# Patient Record
Sex: Female | Born: 1975 | Race: White | Hispanic: No | Marital: Married | Smoking: Former smoker
Health system: Southern US, Community
[De-identification: ages and names within clinical notes are randomized; demographics above are authoritative.]

## PROBLEM LIST (undated history)

## (undated) DIAGNOSIS — F32A Depression, unspecified: Secondary | ICD-10-CM

## (undated) DIAGNOSIS — E785 Hyperlipidemia, unspecified: Secondary | ICD-10-CM

## (undated) DIAGNOSIS — I10 Essential (primary) hypertension: Secondary | ICD-10-CM

## (undated) DIAGNOSIS — G43909 Migraine, unspecified, not intractable, without status migrainosus: Secondary | ICD-10-CM

## (undated) DIAGNOSIS — F319 Bipolar disorder, unspecified: Secondary | ICD-10-CM

## (undated) DIAGNOSIS — F418 Other specified anxiety disorders: Secondary | ICD-10-CM

## (undated) DIAGNOSIS — F419 Anxiety disorder, unspecified: Secondary | ICD-10-CM

## (undated) DIAGNOSIS — E049 Nontoxic goiter, unspecified: Secondary | ICD-10-CM

## (undated) DIAGNOSIS — F329 Major depressive disorder, single episode, unspecified: Secondary | ICD-10-CM

## (undated) DIAGNOSIS — I071 Rheumatic tricuspid insufficiency: Secondary | ICD-10-CM

## (undated) HISTORY — DX: Essential (primary) hypertension: I10

## (undated) HISTORY — DX: Hyperlipidemia, unspecified: E78.5

## (undated) HISTORY — DX: Anxiety disorder, unspecified: F41.9

## (undated) HISTORY — DX: Depression, unspecified: F32.A

## (undated) HISTORY — DX: Nontoxic goiter, unspecified: E04.9

## (undated) HISTORY — DX: Major depressive disorder, single episode, unspecified: F32.9

## (undated) HISTORY — DX: Migraine, unspecified, not intractable, without status migrainosus: G43.909

## (undated) HISTORY — DX: Bipolar disorder, unspecified: F31.9

## (undated) HISTORY — DX: Other specified anxiety disorders: F41.8

## (undated) HISTORY — DX: Rheumatic tricuspid insufficiency: I07.1

---

## 1997-12-26 ENCOUNTER — Emergency Department (HOSPITAL_COMMUNITY): Admission: EM | Admit: 1997-12-26 | Discharge: 1997-12-26 | Payer: Self-pay | Admitting: Emergency Medicine

## 2000-06-20 ENCOUNTER — Inpatient Hospital Stay (HOSPITAL_COMMUNITY): Admission: AD | Admit: 2000-06-20 | Discharge: 2000-06-20 | Payer: Self-pay | Admitting: Obstetrics & Gynecology

## 2001-07-05 ENCOUNTER — Other Ambulatory Visit: Admission: RE | Admit: 2001-07-05 | Discharge: 2001-07-05 | Payer: Self-pay | Admitting: Obstetrics and Gynecology

## 2001-09-14 ENCOUNTER — Encounter: Payer: Self-pay | Admitting: Obstetrics and Gynecology

## 2001-09-14 ENCOUNTER — Ambulatory Visit (HOSPITAL_COMMUNITY): Admission: RE | Admit: 2001-09-14 | Discharge: 2001-09-14 | Payer: Self-pay | Admitting: Obstetrics and Gynecology

## 2001-10-13 ENCOUNTER — Inpatient Hospital Stay (HOSPITAL_COMMUNITY): Admission: AD | Admit: 2001-10-13 | Discharge: 2001-10-13 | Payer: Self-pay | Admitting: Obstetrics and Gynecology

## 2001-10-14 ENCOUNTER — Inpatient Hospital Stay (HOSPITAL_COMMUNITY): Admission: AD | Admit: 2001-10-14 | Discharge: 2001-10-14 | Payer: Self-pay | Admitting: Obstetrics and Gynecology

## 2001-10-20 ENCOUNTER — Encounter: Payer: Self-pay | Admitting: Obstetrics and Gynecology

## 2001-10-20 ENCOUNTER — Inpatient Hospital Stay (HOSPITAL_COMMUNITY): Admission: AD | Admit: 2001-10-20 | Discharge: 2001-10-20 | Payer: Self-pay | Admitting: Obstetrics and Gynecology

## 2001-11-29 ENCOUNTER — Inpatient Hospital Stay (HOSPITAL_COMMUNITY): Admission: AD | Admit: 2001-11-29 | Discharge: 2001-11-29 | Payer: Self-pay | Admitting: Obstetrics and Gynecology

## 2001-12-12 ENCOUNTER — Inpatient Hospital Stay (HOSPITAL_COMMUNITY): Admission: AD | Admit: 2001-12-12 | Discharge: 2001-12-12 | Payer: Self-pay | Admitting: Obstetrics and Gynecology

## 2001-12-22 ENCOUNTER — Inpatient Hospital Stay (HOSPITAL_COMMUNITY): Admission: AD | Admit: 2001-12-22 | Discharge: 2001-12-22 | Payer: Self-pay | Admitting: Obstetrics and Gynecology

## 2001-12-23 ENCOUNTER — Inpatient Hospital Stay (HOSPITAL_COMMUNITY): Admission: AD | Admit: 2001-12-23 | Discharge: 2001-12-25 | Payer: Self-pay | Admitting: Obstetrics and Gynecology

## 2002-05-29 ENCOUNTER — Emergency Department (HOSPITAL_COMMUNITY): Admission: EM | Admit: 2002-05-29 | Discharge: 2002-05-29 | Payer: Self-pay | Admitting: *Deleted

## 2002-12-04 ENCOUNTER — Other Ambulatory Visit: Admission: RE | Admit: 2002-12-04 | Discharge: 2002-12-04 | Payer: Self-pay | Admitting: Obstetrics and Gynecology

## 2003-01-11 ENCOUNTER — Inpatient Hospital Stay (HOSPITAL_COMMUNITY): Admission: AD | Admit: 2003-01-11 | Discharge: 2003-01-11 | Payer: Self-pay | Admitting: Obstetrics and Gynecology

## 2003-05-22 ENCOUNTER — Inpatient Hospital Stay (HOSPITAL_COMMUNITY): Admission: AD | Admit: 2003-05-22 | Discharge: 2003-05-22 | Payer: Self-pay | Admitting: Obstetrics and Gynecology

## 2003-06-01 ENCOUNTER — Inpatient Hospital Stay (HOSPITAL_COMMUNITY): Admission: AD | Admit: 2003-06-01 | Discharge: 2003-06-01 | Payer: Self-pay | Admitting: Obstetrics and Gynecology

## 2003-06-03 ENCOUNTER — Inpatient Hospital Stay (HOSPITAL_COMMUNITY): Admission: AD | Admit: 2003-06-03 | Discharge: 2003-06-03 | Payer: Self-pay | Admitting: Obstetrics and Gynecology

## 2003-06-10 ENCOUNTER — Inpatient Hospital Stay (HOSPITAL_COMMUNITY): Admission: AD | Admit: 2003-06-10 | Discharge: 2003-06-12 | Payer: Self-pay | Admitting: Obstetrics and Gynecology

## 2004-02-29 ENCOUNTER — Encounter: Admission: RE | Admit: 2004-02-29 | Discharge: 2004-02-29 | Payer: Self-pay | Admitting: Family Medicine

## 2004-10-28 ENCOUNTER — Encounter: Admission: RE | Admit: 2004-10-28 | Discharge: 2004-10-28 | Payer: Self-pay | Admitting: Endocrinology

## 2005-06-22 ENCOUNTER — Other Ambulatory Visit: Admission: RE | Admit: 2005-06-22 | Discharge: 2005-06-22 | Payer: Self-pay | Admitting: Obstetrics and Gynecology

## 2005-09-25 ENCOUNTER — Emergency Department (HOSPITAL_COMMUNITY): Admission: EM | Admit: 2005-09-25 | Discharge: 2005-09-25 | Payer: Self-pay | Admitting: Emergency Medicine

## 2006-01-01 ENCOUNTER — Emergency Department (HOSPITAL_COMMUNITY): Admission: EM | Admit: 2006-01-01 | Discharge: 2006-01-01 | Payer: Self-pay | Admitting: Emergency Medicine

## 2006-03-20 ENCOUNTER — Emergency Department (HOSPITAL_COMMUNITY): Admission: EM | Admit: 2006-03-20 | Discharge: 2006-03-20 | Payer: Self-pay | Admitting: Emergency Medicine

## 2006-04-19 ENCOUNTER — Encounter: Admission: RE | Admit: 2006-04-19 | Discharge: 2006-04-19 | Payer: Self-pay | Admitting: Endocrinology

## 2006-12-17 ENCOUNTER — Emergency Department (HOSPITAL_COMMUNITY): Admission: EM | Admit: 2006-12-17 | Discharge: 2006-12-17 | Payer: Self-pay | Admitting: *Deleted

## 2007-04-11 ENCOUNTER — Emergency Department (HOSPITAL_COMMUNITY): Admission: EM | Admit: 2007-04-11 | Discharge: 2007-04-11 | Payer: Self-pay | Admitting: Emergency Medicine

## 2007-04-28 ENCOUNTER — Emergency Department (HOSPITAL_COMMUNITY): Admission: EM | Admit: 2007-04-28 | Discharge: 2007-04-28 | Payer: Self-pay | Admitting: Emergency Medicine

## 2007-07-28 ENCOUNTER — Encounter: Admission: RE | Admit: 2007-07-28 | Discharge: 2007-07-28 | Payer: Self-pay | Admitting: Family Medicine

## 2008-07-20 LAB — HM MAMMOGRAPHY

## 2008-08-14 ENCOUNTER — Encounter: Admission: RE | Admit: 2008-08-14 | Discharge: 2008-08-14 | Payer: Self-pay | Admitting: Family Medicine

## 2010-08-10 ENCOUNTER — Encounter: Payer: Self-pay | Admitting: Endocrinology

## 2010-11-11 ENCOUNTER — Emergency Department (HOSPITAL_COMMUNITY): Admission: EM | Admit: 2010-11-11 | Payer: Self-pay | Source: Home / Self Care

## 2010-12-05 NOTE — H&P (Signed)
[facility]  Patient:    Katrina Atkins, Katrina Atkins Visit Number: [id number] MRN: [id number]          Service Type: OBS Location: 910B 9162 01 Attending Physician:  Jaymes Graff A Dictated by:   Saverio Danker, C.N.M. Admit Date:  12/23/2001                           History and Physical  HISTORY OF PRESENT ILLNESS:   The patient is a 35 year old separated white female gravida 4, para 0-1-2-0 at 35-5/7 weeks by LMP, confirmed by ultrasound, who presents today for further evaluation subsequent to elevated liver enzymes documented yesterday.  She was complaining of headache and visual disturbances yesterday and was evaluated for preeclampsia and was noted to have elevated liver enzymes.  Today, she has no further headache but states that she "just doesnt feel right."  She denies any nausea or vomiting. Denies any right upper quadrant pain.  She does report positive fetal movement.  She denies any leaking or vaginal bleeding.  Her pregnancy has been followed at [facility] by the M.D. service and has been complicated by:  1. History of preeclampsia requiring preterm induction at 34 weeks. 2. History of preterm labor with this pregnancy. 3. Elevated blood pressures sporadically throughout this pregnancy. 4. First trimester spotting. 5. First trimester chlamydia with a negative TOC. 6. Positive group B Strep.  ALLERGIES:                    She has no known drug allergies.  PAST MEDICAL HISTORY:         She reports having had the usual childhood diseases.  She reports occasional urinary tract infections and a history of kidney infections in the past.  History of migraine headaches in the past.  PAST SURGICAL HISTORY:        D&C x2 and hospitalization childbirth x1.  GENETIC HISTORY:              Negative.  FAMILY HISTORY:               Significant for paternal grandfather with MI, maternal grandfather with bypass surgery, maternal  grandfather with hypertension, paternal grandmother with thrombosis, maternal grandmother and paternal grandfather with type 2 diabetes, and maternal grandmother with hypothyroidism.  SOCIAL HISTORY:               She is currently separated from her previous partner, has a new relationship that is supportive.  She is of the [employer].  She denies any illicit drug use, alcohol, or smoking with this pregnancy.  PRENATAL LABORATORY DATA:     Her blood type is O+.  Antibody screen is negative.  Syphilis is nonreactive.  Rubella is immune.  Hepatitis B surface antigen is negative.  HIV is nonreactive.  GC and chlamydia are both negative subsequent to new OB and her one-hour Glucola was within normal range.  Her maternal serum alpha-fetoprotein was also within normal range.  Her 36-week beta Strep was positive.  PHYSICAL EXAMINATION:  VITAL SIGNS:                  Blood pressure 120s/60s.  She is afebrile.  HEENT:                        Grossly within normal limits.  HEART:  Regular rhythm and rate.  CHEST:                        Clear.  BREASTS:                      Soft and nontender.  ABDOMEN:                      Gravid with uterine contractions every 10-12 minutes that are mild.  Her fetal heart rate is reactive and reassuring.  Her cervix is 3-4 cm, 80%, vertex, -2, and posterior.  EXTREMITIES:                  Within normal limits.  LABORATORY DATA:              Her SGPT today is 94, SGOT is 41.  Platelets are 281.  Her urine protein is negative.  ASSESSMENT:                   1. Intrauterine pregnancy at 35-5/7 weeks.                               2. Elevated liver enzymes with history of                                  preeclampsia.                               3. Positive group B Strep.                               4. Advanced cervical change.  PLAN:                         Admit to labor and delivery, perform induction of labor per Dr.  Normand Sloop, and to give her penicillin for group B Strep prophylaxis. Dictated by:   Vance Gather Duplantis, C.N.M. Attending Physician:  Michael Litter DD:  12/23/01 TD:  12/23/01 Job: [id number]
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
## 2010-12-05 NOTE — Discharge Summary (Signed)
[facility]  Patient:    Katrina Atkins, Katrina Atkins Visit Number: [id number] MRN: [id number]          Service Type: OBS Location: 910A 9129 01 Attending Physician:  Jaymes Graff A Dictated by:   Mack Guise, C.N.M. Admit Date:  12/23/2001 Discharge Date: 12/25/2001                             Discharge Summary  ADMISSION DIAGNOSES:          1. Intrauterine pregnancy at 35-5/7 weeks.                               2. Increased liver function tests.                               3. History of preeclampsia.  PROCEDURE:                    Normal spontaneous vaginal delivery.  DISCHARGE DIAGNOSES:          1. Intrauterine pregnancy at 35-5/7 weeks.                               2. Increased liver function tests.                               3. History of preeclampsia.                               4. Normal spontaneous vaginal delivery.  HISTORY OF PRESENT ILLNESS:   Ms. Katrina Atkins is a 35 year old, gravida 4, para 0-1-2-1, who presents at 35-5/7 weeks for evaluation secondary to increased liver function tests.  She does not report any PIH symptoms.  Her SGPT was increased from 89 to 94 and SGOT 41 from 43, platelets 281,000.  Her blood pressure has been normal and she is nonproteinuric, but induction of labor is planned secondary to increased liver function tests and history of preeclampsia.  HOSPITAL COURSE:              Her labor progressed normally and she went on to have a normal spontaneous vaginal delivery with the birth of a 5 pound 13 ounce female infant named Alex with Apgars of 7 at one minute and 8 at five minutes.  Cord pH 7.34.  The patient has done well in the immediate postpartum period.  Her hemoglobin today is 11.4.  Her SGOT and SGPT have been decreasing and her hepatitis panel is negative.  Her blood pressures have all been within normal limits and she is deemed to be in satisfactory condition for discharge.  DISCHARGE INSTRUCTIONS:        Per [facility] handout.  DISCHARGE MEDICATIONS:        1. Motrin 600 mg p.o. q.6h. p.r.n. pain.                               2. Prenatal vitamins.  FOLLOW-UP:                    The patient  will follow up in the office of [facility] on Thursday or Friday for blood pressure check and to recheck liver function tests. Dictated by:   Mack Guise, C.N.M. Attending Physician:  Jaymes Graff A DD:  12/25/01 TD:  12/27/01 Job: [id number]

## 2011-01-30 ENCOUNTER — Encounter: Payer: Self-pay | Admitting: Family Medicine

## 2011-01-30 DIAGNOSIS — F319 Bipolar disorder, unspecified: Secondary | ICD-10-CM | POA: Insufficient documentation

## 2011-02-13 ENCOUNTER — Other Ambulatory Visit: Payer: Self-pay | Admitting: Family Medicine

## 2011-02-13 DIAGNOSIS — Z1231 Encounter for screening mammogram for malignant neoplasm of breast: Secondary | ICD-10-CM

## 2011-02-17 ENCOUNTER — Emergency Department (HOSPITAL_COMMUNITY)
Admission: EM | Admit: 2011-02-17 | Discharge: 2011-02-17 | Disposition: A | Discharge status: Home / Self Care | Payer: Medicaid Other | Attending: Emergency Medicine | Admitting: Emergency Medicine

## 2011-02-17 DIAGNOSIS — M109 Gout, unspecified: Secondary | ICD-10-CM | POA: Insufficient documentation

## 2011-02-19 ENCOUNTER — Ambulatory Visit: Payer: Self-pay

## 2011-02-25 ENCOUNTER — Ambulatory Visit
Admission: RE | Admit: 2011-02-25 | Discharge: 2011-02-25 | Disposition: A | Discharge status: Home / Self Care | Payer: Medicaid Other | Source: Ambulatory Visit | Attending: Family Medicine | Admitting: Family Medicine

## 2011-02-25 DIAGNOSIS — Z1231 Encounter for screening mammogram for malignant neoplasm of breast: Secondary | ICD-10-CM

## 2011-06-12 ENCOUNTER — Encounter (HOSPITAL_COMMUNITY): Payer: Self-pay | Admitting: Emergency Medicine

## 2011-06-12 ENCOUNTER — Emergency Department (HOSPITAL_COMMUNITY)
Admission: EM | Admit: 2011-06-12 | Discharge: 2011-06-12 | Disposition: A | Discharge status: Home / Self Care | Payer: Medicaid Other | Attending: Emergency Medicine | Admitting: Emergency Medicine

## 2011-06-12 DIAGNOSIS — R300 Dysuria: Secondary | ICD-10-CM | POA: Insufficient documentation

## 2011-06-12 DIAGNOSIS — R3915 Urgency of urination: Secondary | ICD-10-CM | POA: Insufficient documentation

## 2011-06-12 DIAGNOSIS — M549 Dorsalgia, unspecified: Secondary | ICD-10-CM | POA: Insufficient documentation

## 2011-06-12 DIAGNOSIS — N390 Urinary tract infection, site not specified: Secondary | ICD-10-CM | POA: Insufficient documentation

## 2011-06-12 LAB — URINALYSIS, ROUTINE W REFLEX MICROSCOPIC
Bilirubin Urine: NEGATIVE
Ketones, ur: NEGATIVE mg/dL
Nitrite: NEGATIVE
Specific Gravity, Urine: 1.025 (ref 1.005–1.030)
Urobilinogen, UA: 0.2 mg/dL (ref 0.0–1.0)

## 2011-06-12 LAB — URINE MICROSCOPIC-ADD ON

## 2011-06-12 MED ORDER — PHENAZOPYRIDINE HCL 200 MG PO TABS: 200.0000 mg | ORAL_TABLET | Freq: Three times a day (TID) | ORAL | Status: AC

## 2011-06-12 MED ORDER — NITROFURANTOIN MONOHYD MACRO 100 MG PO CAPS: 100.0000 mg | ORAL_CAPSULE | Freq: Two times a day (BID) | ORAL | Status: DC

## 2011-06-12 NOTE — ED Notes (Signed)
Pain at end of urination x 1 week. nad

## 2011-06-12 NOTE — ED Notes (Signed)
Patient with no complaints at this time. Respirations even and unlabored. Skin warm/dry. Discharge instructions reviewed with patient at this time. Patient given opportunity to voice concerns/ask questions. Patient discharged at this time and left Emergency Department with steady gait.   

## 2011-06-14 MED ORDER — SULFAMETHOXAZOLE-TRIMETHOPRIM 800-160 MG PO TABS: 1.0000 | ORAL_TABLET | Freq: Two times a day (BID) | ORAL | Status: AC

## 2011-06-14 NOTE — ED Provider Notes (Signed)
History     CSN: [id number] Arrival date & time: 06/12/2011 12:44 PM   First MD Initiated Contact with Patient 06/12/11 1307      Chief Complaint  Patient presents with  . Urinary Urgency    (Consider location/radiation/quality/duration/timing/severity/associated sxs/prior treatment) Patient is a 35 y.o. female presenting with frequency. The history is provided by the patient.  Urinary Frequency The current episode started in the past 7 days. The problem has been unchanged. Pertinent negatives include no abdominal pain, arthralgias, chest pain, chills, congestion, fatigue, fever, headaches, joint swelling, myalgias, nausea, neck pain, numbness, rash, sore throat, vomiting or weakness. Associated symptoms comments: She reports burning pain with urination along with suprapubic fullness sensation and low back aching.  She denies vaginal discharge and flank pain.. Exacerbated by: urinating. Treatments tried: Increased fluids including cranberry juice. The treatment provided no relief.    Past Medical History  Diagnosis Date  . Migraine headache   . Bipolar 1 disorder   . Tricuspid regurgitation   . Goiter     History reviewed. No pertinent past surgical history.  No family history on file.  History  Substance Use Topics  . Smoking status: Former Games developer  . Smokeless tobacco: Not on file  . Alcohol Use: No    OB History    Grav Para Term Preterm Abortions TAB SAB Ect Mult Living                  Review of Systems  Constitutional: Negative for fever, chills and fatigue.  HENT: Negative for congestion, sore throat and neck pain.   Eyes: Negative.   Respiratory: Negative for chest tightness and shortness of breath.   Cardiovascular: Negative for chest pain.  Gastrointestinal: Negative for nausea, vomiting and abdominal pain.  Genitourinary: Positive for dysuria, urgency and frequency. Negative for vaginal bleeding and vaginal discharge.  Musculoskeletal: Positive for back  pain. Negative for myalgias, joint swelling and arthralgias.  Skin: Negative.  Negative for rash and wound.  Neurological: Negative for dizziness, weakness, light-headedness, numbness and headaches.  Hematological: Negative.   Psychiatric/Behavioral: Negative.     Allergies  Review of patient's allergies indicates no known allergies.  Home Medications   Current Outpatient Rx  Name Route Sig Dispense Refill  . ACETAMINOPHEN 500 MG PO TABS Oral Take 1,000 mg by mouth every 6 (six) hours as needed. Pain     . LEVONORGESTREL 20 MCG/24HR IU IUD Intrauterine 1 each by Intrauterine route once.      Jeananne Rama SULFATE 0.05-0.25 % OP SOLN Both Eyes Place 2 drops into both eyes 3 (three) times daily as needed. Dry Eyes     . VITAMIN D (ERGOCALCIFEROL) 50000 UNITS PO CAPS Oral Take 50,000 Units by mouth every 7 (seven) days. Patient takes every Saturday.     Marland Kitchen NITROFURANTOIN MONOHYD MACRO 100 MG PO CAPS Oral Take 1 capsule (100 mg total) by mouth 2 (two) times daily. 14 capsule 0  . PHENAZOPYRIDINE HCL 200 MG PO TABS Oral Take 1 tablet (200 mg total) by mouth 3 (three) times daily. 6 tablet 0    BP 143/80  Pulse 76  Temp(Src) 98.2 F (36.8 C) (Oral)  Resp 19  SpO2 97%  LMP 04/12/2011  Physical Exam  Nursing note and vitals reviewed. Constitutional: She is oriented to person, place, and time. She appears well-developed and well-nourished.  HENT:  Head: Normocephalic and atraumatic.  Eyes: Conjunctivae are normal.  Neck: Normal range of motion.  Cardiovascular: Normal rate, regular rhythm,  normal heart sounds and intact distal pulses.   Pulmonary/Chest: Effort normal and breath sounds normal. She has no wheezes.  Abdominal: Soft. Bowel sounds are normal. There is no tenderness. There is no rebound.       Slight discomfort to palpation at suprapubic area without pain or guarding.  Musculoskeletal: Normal range of motion. She exhibits no tenderness.  Neurological: She is alert  and oriented to person, place, and time.  Skin: Skin is warm and dry.  Psychiatric: She has a normal mood and affect.    ED Course  Procedures (including critical care time)  Labs Reviewed  URINALYSIS, ROUTINE W REFLEX MICROSCOPIC - Abnormal; Notable for the following:    Appearance CLOUDY (*)    Hgb urine dipstick TRACE (*)    Leukocytes, UA MODERATE (*)    All other components within normal limits  URINE MICROSCOPIC-ADD ON - Abnormal; Notable for the following:    Squamous Epithelial / LPF FEW (*)    Bacteria, UA MANY (*)    All other components within normal limits  PREGNANCY, URINE  LAB REPORT - SCANNED   No results found.   1. Urinary tract infection       MDM  UTI.        Candis Musa, PA 06/14/11 [phone]

## 2011-06-14 NOTE — ED Provider Notes (Signed)
Pt called back due to symptoms of dizziness and HA and thinks she is having a reaction to the macrobid because her friends tell her that they have all had reactions to macrobid and so she requests a different abx.  Will rewrite her for 1 week of bactrim per her request.  She is also told to follow up with her PCP.  Gavin Pound. Oletta Lamas, MD 06/14/11 [phone]

## 2011-06-14 NOTE — ED Provider Notes (Signed)
Medical screening examination/treatment/procedure(s) were performed by non-physician practitioner and as supervising physician I was immediately available for consultation/collaboration. Keajah Killough, MD, FACEP   Hendy Brindle L Verneda Hollopeter, MD 06/14/11 0723 

## 2012-01-26 ENCOUNTER — Ambulatory Visit: Payer: Medicaid Other | Admitting: Family Medicine

## 2012-02-08 ENCOUNTER — Ambulatory Visit: Payer: Medicaid Other | Admitting: Family Medicine

## 2012-02-08 ENCOUNTER — Encounter: Payer: Self-pay | Admitting: Family Medicine

## 2012-02-23 ENCOUNTER — Ambulatory Visit (INDEPENDENT_AMBULATORY_CARE_PROVIDER_SITE_OTHER): Payer: Medicaid Other | Admitting: Family Medicine

## 2012-02-23 ENCOUNTER — Encounter: Payer: Self-pay | Admitting: Family Medicine

## 2012-02-23 VITALS — BP 132/84 | HR 92 | Temp 97.8°F | Ht 62.0 in | Wt 206.6 lb

## 2012-02-23 DIAGNOSIS — R600 Localized edema: Secondary | ICD-10-CM

## 2012-02-23 DIAGNOSIS — I10 Essential (primary) hypertension: Secondary | ICD-10-CM

## 2012-02-23 DIAGNOSIS — E785 Hyperlipidemia, unspecified: Secondary | ICD-10-CM

## 2012-02-23 DIAGNOSIS — Z8739 Personal history of other diseases of the musculoskeletal system and connective tissue: Secondary | ICD-10-CM | POA: Insufficient documentation

## 2012-02-23 DIAGNOSIS — R609 Edema, unspecified: Secondary | ICD-10-CM

## 2012-02-23 DIAGNOSIS — Z862 Personal history of diseases of the blood and blood-forming organs and certain disorders involving the immune mechanism: Secondary | ICD-10-CM

## 2012-02-23 DIAGNOSIS — Z0000 Encounter for general adult medical examination without abnormal findings: Secondary | ICD-10-CM

## 2012-02-23 MED ORDER — IBUPROFEN 600 MG PO TABS: 600.0000 mg | ORAL_TABLET | Freq: Three times a day (TID) | ORAL | Status: AC | PRN

## 2012-02-23 NOTE — Assessment & Plan Note (Signed)
Blood pressure 132/84, diet controlled.  Encouraged patient to continue to avoid high sodium foods and increase physical activity. Will attempt to control with lifestyle modifications for 6 months. If blood pressure continues to be elevated, will consider starting antihypertensive medications.

## 2012-02-23 NOTE — Progress Notes (Signed)
  Subjective:    Patient ID: Katrina Atkins, female    DOB: 04/01/1976, 36 y.o.   MRN: [id number]  HPI  Patient presents to clinic to establish care. Patient was last seen at [facility] in [place]. Patient complains of left lower extremity swelling and redness. This occurred last week. Patient went to see a physician in [facility]. Lab results were negative and Doppler ruled out DVT. Patient says leg edema and redness have been improving, but she still complains of persistent left heel pain. Patient has a history of gout. Denies any numbness or tingling of lower extremities.  Patient also says she has been diagnosed with high cholesterol, hypertension, and thyroid disease in the past.  She is willing to sign a release of information for her previous records.  Patient says she is now working on her diet, avoiding fried in salty foods. Patient says her blood pressure has improved since then. She has not taken any medication for hypertension or high cholesterol or thyroid disease.  Review of Systems  Per history of present illness    Objective:   Physical Exam  HENT:  Head: Normocephalic and atraumatic.  Cardiovascular: Normal rate and regular rhythm.   Murmur heard. Pulmonary/Chest: Effort normal and breath sounds normal.  Musculoskeletal: Normal range of motion. She exhibits no tenderness.       Mild non-pitting edema LT ankle; no erythema; no calf tenderness; strong pulses  Skin: Skin is warm and dry. No erythema.    Filed Vitals:   02/23/12 1347  BP: 132/84  Pulse: 92  Temp: 97.8 F (36.6 C)          Assessment & Plan:

## 2012-02-23 NOTE — Assessment & Plan Note (Signed)
Patient has a history of gout - unilateral ankle redness and swelling suspicious for gout. Will treat with Motrin 600 q 6 hour PRN x 5 days. Recommended DASH diet, leg elevation at bedtime to decrease edema.

## 2012-02-23 NOTE — Patient Instructions (Addendum)
Great to see you again. Please schedule Lab appointment in the morning: TSH, lipid panel, Vit D level. Pick up high dose Motrin and take for leg pain x 5 days. Elevate your leg over your heart at bedtime. Come back and see me when you are ready for a pap smear.  Gout Gout is an inflammatory condition (arthritis) caused by a buildup of uric acid crystals in the joints. Uric acid is a chemical that is normally present in the blood. Under some circumstances, uric acid can form into crystals in your joints. This causes joint redness, soreness, and swelling (inflammation). Repeat attacks are common. Over time, uric acid crystals can form into masses (tophi) near a joint, causing disfigurement. Gout is treatable and often preventable. CAUSES  The disease begins with elevated levels of uric acid in the blood. Uric acid is produced by your body when it breaks down a naturally found substance called purines. This also happens when you eat certain foods such as meats and fish. Causes of an elevated uric acid level include:  Being passed down from parent to child (heredity).   Diseases that cause increased uric acid production (obesity, psoriasis, some cancers).   Excessive alcohol use.   Diet, especially diets rich in meat and seafood.   Medicines, including certain cancer-fighting drugs (chemotherapy), diuretics, and aspirin.   Chronic kidney disease. The kidneys are no longer able to remove uric acid well.   Problems with metabolism.  Conditions strongly associated with gout include:  Obesity.   High blood pressure.   High cholesterol.   Diabetes.  Not everyone with elevated uric acid levels gets gout. It is not understood why some people get gout and others do not. Surgery, joint injury, and eating too much of certain foods are some of the factors that can lead to gout. SYMPTOMS   An attack of gout comes on quickly. It causes intense pain with redness, swelling, and warmth in a joint.     Fever can occur.   Often, only one joint is involved. Certain joints are more commonly involved:   Base of the big toe.   Knee.   Ankle.   Wrist.   Finger.  Without treatment, an attack usually goes away in a few days to weeks. Between attacks, you usually will not have symptoms, which is different from many other forms of arthritis. DIAGNOSIS  Your caregiver will suspect gout based on your symptoms and exam. Removal of fluid from the joint (arthrocentesis) is done to check for uric acid crystals. Your caregiver will give you a medicine that numbs the area (local anesthetic) and use a needle to remove joint fluid for exam. Gout is confirmed when uric acid crystals are seen in joint fluid, using a special microscope. Sometimes, blood, urine, and X-ray tests are also used. TREATMENT  There are 2 phases to gout treatment: treating the sudden onset (acute) attack and preventing attacks (prophylaxis). Treatment of an Acute Attack  Medicines are used. These include anti-inflammatory medicines or steroid medicines.   An injection of steroid medicine into the affected joint is sometimes necessary.   The painful joint is rested. Movement can worsen the arthritis.   You may use warm or cold treatments on painful joints, depending which works best for you.   Discuss the use of coffee, vitamin C, or cherries with your caregiver. These may be helpful treatment options.  Treatment to Prevent Attacks After the acute attack subsides, your caregiver may advise prophylactic medicine. These medicines either  help your kidneys eliminate uric acid from your body or decrease your uric acid production. You may need to stay on these medicines for a very long time. The early phase of treatment with prophylactic medicine can be associated with an increase in acute gout attacks. For this reason, during the first few months of treatment, your caregiver may also advise you to take medicines usually used for  acute gout treatment. Be sure you understand your caregiver's directions. You should also discuss dietary treatment with your caregiver. Certain foods such as meats and fish can increase uric acid levels. Other foods such as dairy can decrease levels. Your caregiver can give you a list of foods to avoid. HOME CARE INSTRUCTIONS   Do not take aspirin to relieve pain. This raises uric acid levels.   Only take over-the-counter or prescription medicines for pain, discomfort, or fever as directed by your caregiver.   Rest the joint as much as possible. When in bed, keep sheets and blankets off painful areas.   Keep the affected joint raised (elevated).   Use crutches if the painful joint is in your leg.   Drink enough water and fluids to keep your urine clear or pale yellow. This helps your body get rid of uric acid. Do not drink alcoholic beverages. They slow the passage of uric acid.   Follow your caregiver's dietary instructions. Pay careful attention to the amount of protein you eat. Your daily diet should emphasize fruits, vegetables, whole grains, and fat-free or low-fat milk products.   Maintain a healthy body weight.  SEEK MEDICAL CARE IF:   You have an oral temperature above 102 F (38.9 C).   You develop diarrhea, vomiting, or any side effects from medicines.   You do not feel better in 24 hours, or you are getting worse.  SEEK IMMEDIATE MEDICAL CARE IF:   Your joint becomes suddenly more tender and you have:   Chills.   An oral temperature above 102 F (38.9 C), not controlled by medicine.  MAKE SURE YOU:   Understand these instructions.   Will watch your condition.   Will get help right away if you are not doing well or get worse.  Document Released: 07/03/2000 Document Revised: 06/25/2011 Document Reviewed: 10/14/2009 [facility] Patient Information 2012 [place], [place].

## 2012-02-25 ENCOUNTER — Other Ambulatory Visit: Payer: Medicaid Other

## 2012-03-01 ENCOUNTER — Other Ambulatory Visit: Payer: Medicaid Other

## 2012-03-07 ENCOUNTER — Ambulatory Visit: Payer: Medicaid Other | Admitting: Family Medicine

## 2012-03-29 ENCOUNTER — Ambulatory Visit: Payer: Medicaid Other | Admitting: Family Medicine

## 2012-04-11 ENCOUNTER — Ambulatory Visit: Payer: Medicaid Other | Admitting: Family Medicine

## 2012-06-08 ENCOUNTER — Other Ambulatory Visit: Payer: Self-pay | Admitting: Family Medicine

## 2012-06-08 DIAGNOSIS — Z1231 Encounter for screening mammogram for malignant neoplasm of breast: Secondary | ICD-10-CM

## 2012-07-15 ENCOUNTER — Other Ambulatory Visit: Payer: Self-pay | Admitting: Family Medicine

## 2012-07-15 MED ORDER — PERMETHRIN 5 % EX CREA: TOPICAL_CREAM | Freq: Once | CUTANEOUS | Status: DC

## 2012-07-18 ENCOUNTER — Ambulatory Visit
Admission: RE | Admit: 2012-07-18 | Discharge: 2012-07-18 | Disposition: A | Discharge status: Home / Self Care | Payer: Medicaid Other | Source: Ambulatory Visit | Attending: Family Medicine | Admitting: Family Medicine

## 2012-07-18 DIAGNOSIS — Z1231 Encounter for screening mammogram for malignant neoplasm of breast: Secondary | ICD-10-CM

## 2012-08-09 ENCOUNTER — Telehealth: Payer: Self-pay | Admitting: *Deleted

## 2012-08-09 NOTE — Telephone Encounter (Signed)
Jodie from Dr. Randa Evens office (Oral Surgeon) left message on pharmacy/physician line inquiring about surgical clearance request they have sent to [place] on Ms. Bel[place].   I do not see a form in Dr. Sherron Flemings Cruz's box.  Will forward message to Dr. Tye Savoy to see if she has received and completed form for surgical clearance.  Jodie can be reached at [phone].   Ileana Ladd

## 2012-08-17 NOTE — Telephone Encounter (Signed)
I faxed patient's last physical exam office note today.

## 2012-10-24 ENCOUNTER — Other Ambulatory Visit: Payer: Self-pay | Admitting: Family Medicine

## 2012-10-24 MED ORDER — PERMETHRIN 5 % EX CREA: TOPICAL_CREAM | Freq: Once | CUTANEOUS | Status: AC

## 2012-10-24 NOTE — Telephone Encounter (Signed)
Children here with scabies. Rx for mother sent as well due to her having similar rash.

## 2016-01-07 ENCOUNTER — Encounter (HOSPITAL_COMMUNITY): Payer: Self-pay

## 2016-01-07 ENCOUNTER — Emergency Department (HOSPITAL_COMMUNITY)
Admission: EM | Admit: 2016-01-07 | Discharge: 2016-01-07 | Disposition: A | Discharge status: Home / Self Care | Payer: BLUE CROSS/BLUE SHIELD | Attending: Emergency Medicine | Admitting: Emergency Medicine

## 2016-01-07 ENCOUNTER — Emergency Department (HOSPITAL_COMMUNITY): Payer: BLUE CROSS/BLUE SHIELD

## 2016-01-07 DIAGNOSIS — E785 Hyperlipidemia, unspecified: Secondary | ICD-10-CM | POA: Insufficient documentation

## 2016-01-07 DIAGNOSIS — I10 Essential (primary) hypertension: Secondary | ICD-10-CM | POA: Diagnosis not present

## 2016-01-07 DIAGNOSIS — W108XXA Fall (on) (from) other stairs and steps, initial encounter: Secondary | ICD-10-CM | POA: Diagnosis not present

## 2016-01-07 DIAGNOSIS — S99911A Unspecified injury of right ankle, initial encounter: Secondary | ICD-10-CM

## 2016-01-07 DIAGNOSIS — Y939 Activity, unspecified: Secondary | ICD-10-CM | POA: Diagnosis not present

## 2016-01-07 DIAGNOSIS — Y999 Unspecified external cause status: Secondary | ICD-10-CM | POA: Insufficient documentation

## 2016-01-07 DIAGNOSIS — F319 Bipolar disorder, unspecified: Secondary | ICD-10-CM | POA: Insufficient documentation

## 2016-01-07 DIAGNOSIS — S92151A Displaced avulsion fracture (chip fracture) of right talus, initial encounter for closed fracture: Secondary | ICD-10-CM | POA: Diagnosis not present

## 2016-01-07 DIAGNOSIS — Y929 Unspecified place or not applicable: Secondary | ICD-10-CM | POA: Insufficient documentation

## 2016-01-07 DIAGNOSIS — Z87891 Personal history of nicotine dependence: Secondary | ICD-10-CM | POA: Insufficient documentation

## 2016-01-07 DIAGNOSIS — S82891A Other fracture of right lower leg, initial encounter for closed fracture: Secondary | ICD-10-CM

## 2016-01-07 DIAGNOSIS — M25571 Pain in right ankle and joints of right foot: Secondary | ICD-10-CM | POA: Diagnosis present

## 2016-01-07 NOTE — Discharge Instructions (Signed)
Wear ankle splint at all times until you see the orthopedist. Use crutches for all weight bearing activities. Ice and elevate ankle throughout the day, using ice pack for no more than 20 minutes every hour. Alternate between tylenol and motrin as needed for pain relief. Call orthopedic follow up today or tomorrow to schedule followup appointment for recheck of ongoing ankle pain in 1-2 weeks. Return to the ER for changes or worsening symptoms.

## 2016-01-07 NOTE — ED Provider Notes (Signed)
CSN: [id number]     Arrival date & time 01/07/16  1019 History   First MD Initiated Contact with Patient 01/07/16 1128     Chief Complaint  Patient presents with  . Fall  . Ankle Pain     (Consider location/radiation/quality/duration/timing/severity/associated sxs/prior Treatment) HPI Comments: Katrina Atkins is a 40 y.o. female with a PMHx of migraines, bipolar 1 disorder, HLD, HTN, depression/anxiety, goiter, and tricuspid regurg, who presents to the ED with complaints of right ankle and foot injury that occurred last night when she missed a step causing her to twist her ankle and slipped down 3 steps. She describes the pain as 7/10 constant sharp right lateral ankle/foot pain, nonradiating, worse with weightbearing and movement, and improved with ice, elevation, rest, and Tylenol. Associated symptoms include swelling and bruising of the lateral foot. She denies any head injury or loss of consciousness, knee pain or any other injury to any other extremity. She also denies fevers, chills, CP, SOB, abd pain, N/V/D/C, hematuria, dysuria, myalgias, numbness, tingling, weakness, or skin injury. She has previously "twisted that ankle" in the past, but no known prior fractures to this foot/ankle.   Patient is a 40 y.o. female presenting with fall and ankle pain. The history is provided by the patient. No language interpreter was used.  Fall Associated symptoms include arthralgias (R ankle) and joint swelling. Pertinent negatives include no abdominal pain, chest pain, chills, fever, myalgias, nausea, numbness, vomiting or weakness.  Ankle Pain Location:  Ankle and foot Time since incident:  1 day Injury: yes   Mechanism of injury: fall   Ankle location:  R ankle Foot location:  R foot Pain details:    Quality:  Sharp   Radiates to:  Does not radiate   Severity:  Moderate   Onset quality:  Sudden   Duration:  1 day   Timing:  Constant   Progression:  Unchanged Chronicity:  New Dislocation:  no   Foreign body present:  No foreign bodies Prior injury to area:  Yes ("I've twisted it before") Relieved by:  Ice, rest, acetaminophen and elevation Worsened by:  Bearing weight and activity Ineffective treatments:  None tried Associated symptoms: swelling   Associated symptoms: no decreased ROM, no fever, no muscle weakness, no numbness and no tingling     Past Medical History  Diagnosis Date  . Migraine headache   . Bipolar 1 disorder (HCC)   . Tricuspid regurgitation   . Goiter   . Depression with anxiety   . Hyperlipidemia   . Hypertension   . Anxiety   . Depression    History reviewed. No pertinent past surgical history. Family History  Problem Relation Age of Onset  . Cancer Mother     breast  . Cancer Maternal Grandmother     breast   Social History  Substance Use Topics  . Smoking status: Former Smoker    Quit date: 02/23/2003  . Smokeless tobacco: Never Used  . Alcohol Use: No   OB History    No data available     Review of Systems  Constitutional: Negative for fever and chills.  HENT: Negative for facial swelling (no head inj).   Respiratory: Negative for shortness of breath.   Cardiovascular: Negative for chest pain.  Gastrointestinal: Negative for nausea, vomiting, abdominal pain, diarrhea and constipation.  Genitourinary: Negative for dysuria and hematuria.  Musculoskeletal: Positive for joint swelling and arthralgias (R ankle). Negative for myalgias.  Skin: Positive for color change (bruising at  R foot). Negative for wound.  Allergic/Immunologic: Negative for immunocompromised state.  Neurological: Negative for syncope, weakness and numbness.  Psychiatric/Behavioral: Negative for confusion.   10 Systems reviewed and are negative for acute change except as noted in the HPI.    Allergies  Codeine  Home Medications   Prior to Admission medications   Medication Sig Start Date End Date Taking? Authorizing Provider  acetaminophen (TYLENOL)  500 MG tablet Take 1,000 mg by mouth every 6 (six) hours as needed. Pain     Historical Provider, MD  levonorgestrel (MIRENA) 20 MCG/24HR IUD 1 each by Intrauterine route once.      Historical Provider, MD  permethrin (ELIMITE) 5 % cream Apply topically once. 10/24/12   Shelly RubensteinAngela J Oh [place], MD  tetrahydrozoline-zinc (VISINE-AC) 0.05-0.25 % ophthalmic solution Place 2 drops into both eyes 3 (three) times daily as needed. Dry Eyes     Historical Provider, MD  Vitamin D, Ergocalciferol, (DRISDOL) 50000 UNITS CAPS Take 50,000 Units by mouth every 7 (seven) days. Patient takes every Saturday.     Historical Provider, MD   BP 149/82 mmHg  Pulse 86  Temp(Src) 98.2 F (36.8 C) (Oral)  SpO2 100% Physical Exam  Constitutional: She is oriented to person, place, and time. Vital signs are normal. She appears well-developed and well-nourished.  Non-toxic appearance. No distress.  Afebrile, nontoxic, NAD  HENT:  Head: Normocephalic and atraumatic.  Mouth/Throat: Mucous membranes are normal.  Eyes: Conjunctivae and EOM are normal. Right eye exhibits no discharge. Left eye exhibits no discharge.  Neck: Normal range of motion. Neck supple.  Cardiovascular: Normal rate and intact distal pulses.   Pulmonary/Chest: Effort normal. No respiratory distress.  Abdominal: Normal appearance. She exhibits no distension.  Musculoskeletal: Normal range of motion.       Right ankle: She exhibits swelling and ecchymosis. She exhibits normal range of motion, no deformity, no laceration and normal pulse. Tenderness. Head of 5th metatarsal tenderness found. Achilles tendon normal.       Right foot: There is tenderness, bony tenderness and swelling. There is normal range of motion, normal capillary refill, no crepitus, no deformity and no laceration.       Feet:  R ankle with FROM intact, with focal TTP along lateral aspect near the 5th metatarsal, +swelling without deformity, +bruising to the lateral foot, without focal TTP of  lateral/medial malleoli or calf. No break in skin. No erythema. No warmth. Achilles intact. Good pedal pulse and cap refill of all toes. Wiggling toes without difficulty. Sensation grossly intact. Soft compartments   Neurological: She is alert and oriented to person, place, and time. She has normal strength. No sensory deficit.  Skin: Skin is warm, dry and intact. No rash noted.  Psychiatric: She has a normal mood and affect. Her behavior is normal.  Nursing note and vitals reviewed.   ED Course  Procedures (including critical care time)  SPLINT APPLICATION Date/Time: 11:46 AM Authorized by: Ramond Marrowamprubi-Soms, Leina Babe Strupp Consent: Verbal consent obtained. Risks and benefits: risks, benefits and alternatives were discussed Consent given by: patient Splint applied by: orthopedic technician Location details: R ankle Splint type: posterior short leg Supplies used: orthoglass Post-procedure: The splinted body part was neurovascularly unchanged following the procedure. Patient tolerance: Patient tolerated the procedure well with no immediate complications.    Labs Review Labs Reviewed - No data to display  Imaging Review Dg Ankle Complete Right  01/07/2016  CLINICAL DATA:  Right lateral foot bruising, status post fall with medial right foot  and right ankle pain. EXAM: RIGHT ANKLE - COMPLETE 3+ VIEW; RIGHT FOOT COMPLETE - 3+ VIEW COMPARISON:  None. FINDINGS: There is a small calcific fragment adjacent to the lateral aspect of the transverse tarsal joint with uncertain donor site. There is an associated soft tissue swelling. No other displaced fractures are visualized. The right ankle has normal appearance.  Ankle mortise is preserved. IMPRESSION: Small calcific fragment adjacent to the lateral aspect of the transverse tarsal joint with uncertain donor site, likely representing small avulsion fracture. Alternatively this may represent an os peroneum. If patient continues to be symptomatic, MRI  of the foot may be considered. Associated moderate soft tissue swelling. Electronically Signed   By: Ted Mcalpine M.D.   On: 01/07/2016 11:05   Dg Foot Complete Right  01/07/2016  CLINICAL DATA:  Right lateral foot bruising, status post fall with medial right foot and right ankle pain. EXAM: RIGHT ANKLE - COMPLETE 3+ VIEW; RIGHT FOOT COMPLETE - 3+ VIEW COMPARISON:  None. FINDINGS: There is a small calcific fragment adjacent to the lateral aspect of the transverse tarsal joint with uncertain donor site. There is an associated soft tissue swelling. No other displaced fractures are visualized. The right ankle has normal appearance.  Ankle mortise is preserved. IMPRESSION: Small calcific fragment adjacent to the lateral aspect of the transverse tarsal joint with uncertain donor site, likely representing small avulsion fracture. Alternatively this may represent an os peroneum. If patient continues to be symptomatic, MRI of the foot may be considered. Associated moderate soft tissue swelling. Electronically Signed   By: Ted Mcalpine M.D.   On: 01/07/2016 11:05   I have personally reviewed and evaluated these images and lab results as part of my medical decision-making.   EKG Interpretation None      MDM   Final diagnoses:  Avulsion fracture of ankle, right, closed, initial encounter  Right ankle injury, initial encounter    40 y.o. female here with R ankle inj, twisted it last night when she missed a step and slid down 3 steps. No head inj/LOC. +Bruising/swelling to lateral foot, tenderness over base of 5th metatarsal region. NVI with soft compartments. Xray shows possible avulsion fx vs os peroneum, given acute injury will tx as fx; xray recommending MRI if she continues to be symptomatic, discussed that we'll tx with splint and crutches, have her f/up with ortho and they can perform MRI if indicated. Posterior splint applied, crutches given, pt declined wanting pain meds, discussed  tylenol/motrin and RICE. F/up with ortho in 1wk. I explained the diagnosis and have given explicit precautions to return to the ER including for any other new or worsening symptoms. The patient understands and accepts the medical plan as it's been dictated and I have answered their questions. Discharge instructions concerning home care and prescriptions have been given. The patient is STABLE and is discharged to home in good condition.   BP 149/82 mmHg  Pulse 86  Temp(Src) 98.2 F (36.8 C) (Oral)  SpO2 100%  No orders of the defined types were placed in this encounter.     [address] [place], PA-C 01/07/16 1155  Zadie Rhine, MD 01/08/16 [phone]

## 2016-01-07 NOTE — ED Notes (Signed)
Pt fell down 3 steps last night twisting rt ankle. Pt with pain and swelling in ankle and foot.

## 2016-03-03 ENCOUNTER — Encounter: Payer: Self-pay | Admitting: Family Medicine

## 2016-03-03 ENCOUNTER — Ambulatory Visit (INDEPENDENT_AMBULATORY_CARE_PROVIDER_SITE_OTHER): Payer: BLUE CROSS/BLUE SHIELD | Admitting: Family Medicine

## 2016-03-03 ENCOUNTER — Ambulatory Visit (HOSPITAL_COMMUNITY)
Admission: RE | Admit: 2016-03-03 | Discharge: 2016-03-03 | Disposition: A | Discharge status: Home / Self Care | Payer: BLUE CROSS/BLUE SHIELD | Source: Ambulatory Visit | Attending: Family Medicine | Admitting: Family Medicine

## 2016-03-03 VITALS — BP 168/90 | HR 100 | Temp 98.6°F | Ht 62.0 in | Wt 226.6 lb

## 2016-03-03 DIAGNOSIS — R002 Palpitations: Secondary | ICD-10-CM | POA: Insufficient documentation

## 2016-03-03 DIAGNOSIS — Z3049 Encounter for surveillance of other contraceptives: Secondary | ICD-10-CM | POA: Diagnosis not present

## 2016-03-03 DIAGNOSIS — I10 Essential (primary) hypertension: Secondary | ICD-10-CM | POA: Diagnosis not present

## 2016-03-03 DIAGNOSIS — Z309 Encounter for contraceptive management, unspecified: Secondary | ICD-10-CM | POA: Insufficient documentation

## 2016-03-03 NOTE — Assessment & Plan Note (Signed)
Has IUD in place.  She also needs pap smear updated.

## 2016-03-03 NOTE — Progress Notes (Signed)
   Subjective: CC: palpitations ZOX:WRUEAVWHPI:Katrina Atkins is a 40 y.o. female presenting to clinic today for same day appointment. PCP: Almon Herculesaye T Gonfa, MD Concerns today include:  1. Palpitations Patient reports that she has had heart palpitations all her life.  She reports that she noticed them a couple of nights ago.  Palpitations were intermittent.  She reports increased stress during those days.  Denies fevers, chills, nausea or vomiting.  She reports that she fractured her RLE and has been seen by [place] ortho.  Pain is controlled.  No hematochezia, melena, hematuria.  Has periods but no excessive bleeding.  Has had borderline Thyroid levels in the past.  No diarrhea.  Social History Reviewed: non smoker. FamHx and MedHx reviewed.  Please see EMR. Health Maintenance: pap due  ROS: Per HPI  Objective: Office vital signs reviewed. BP (!) 168/90   Pulse 100   Temp 98.6 F (37 C) (Oral)   Ht 5\' 2"  (1.575 m)   Wt 226 lb 9.6 oz (102.8 kg)   BMI 41.45 kg/m   Physical Examination:  General: Awake, alert, obese, No acute distress Neck: no thyromegaly or masses appreciated. Cardio: slightly tachycardic, regular rhythm, S1S2 heard, no murmurs appreciated Pulm: clear to auscultation bilaterally, no wheezes, rhonchi or rales, normal WOB on room air  Assessment/ Plan: 40 y.o. female   Benign essential hypertension Patient not on medications.  BP elevated.  Suspect some component of anxiety.  No red flag signs.  BMP today.  EKG nonischemic.  No arrhythmia. - Discussed initiating antihypertensive, but patient not amenable to this - Asks that we defer to next appt with PCP.  Palpitations Suspect some component of stress and anxiety.  Will evaluate for metabolic causes.  Has h/o thyroid nodules [facility] but patient has not been following up for several years.  EKG nonischemic.  No arrhythmia.  Encounter for contraceptive management Has IUD in place.  She also needs  pap smear updated.  Orders Placed This Encounter  Procedures  . BASIC METABOLIC PANEL WITH GFR  . CBC  . TSH  . EKG 12-Lead   Follow up in next 2 weeks for annual exam, pap smear.  Will call patient with lab results per her request.  Raliegh IpAshly M Gottschalk, DO PGY-3, [facility]

## 2016-03-03 NOTE — Patient Instructions (Addendum)
Schedule an appointment with your Dr Alanda SlimGonfa for pap smear/ yearly physical exam w/ labs.  I will contact you will the results of your labs.  If anything is abnormal, I will call you.   You can expect a copy to be mailed to you.  Your blood pressure was elevated twice during this appointment.  You may need to start blood pressure medications.  Palpitations A palpitation is the feeling that your heartbeat is irregular. It may feel like your heart is fluttering or skipping a beat. It may also feel like your heart is beating faster than normal. This is usually not a serious problem. In some cases, you may need more medical tests. HOME CARE  Avoid:  Caffeine in coffee, tea, soft drinks, diet pills, and energy drinks.  Chocolate.  Alcohol.  Stop smoking if you smoke.  Reduce your stress and anxiety. Try:  A method that measures bodily functions so you can learn to control them (biofeedback).  Yoga.  Meditation.  Physical activity such as swimming, jogging, or walking.  Get plenty of rest and sleep. GET HELP IF:  Your fast or irregular heartbeat continues after 24 hours.  Your palpitations occur more often. GET HELP RIGHT AWAY IF:   You have chest pain.  You feel short of breath.  You have a very bad headache.  You feel dizzy or pass out (faint). MAKE SURE YOU:   Understand these instructions.  Will watch your condition.  Will get help right away if you are not doing well or get worse.   This information is not intended to replace advice given to you by your health care provider. Make sure you discuss any questions you have with your health care provider.   Document Released: 04/14/2008 Document Revised: 07/27/2014 Document Reviewed: 09/04/2011 Elsevier Interactive Patient Education [employer]

## 2016-03-03 NOTE — Assessment & Plan Note (Addendum)
Patient not on medications.  BP elevated.  Suspect some component of anxiety.  No red flag signs.  BMP today.  EKG nonischemic.  No arrhythmia. - Discussed initiating antihypertensive, but patient not amenable to this - Asks that we defer to next appt with PCP.

## 2016-03-03 NOTE — Assessment & Plan Note (Addendum)
Suspect some component of stress and anxiety.  Will evaluate for metabolic causes.  Has h/o thyroid nodules that [facility] but patient has not been following up for several years.  EKG nonischemic.  No arrhythmia.

## 2016-03-04 ENCOUNTER — Telehealth (HOSPITAL_COMMUNITY): Payer: Self-pay | Admitting: Family Medicine

## 2016-03-04 ENCOUNTER — Encounter (HOSPITAL_COMMUNITY): Payer: Self-pay | Admitting: Family Medicine

## 2016-03-04 LAB — CBC
HEMATOCRIT: 40.1 % (ref 35.0–45.0)
HEMOGLOBIN: 13.4 g/dL (ref 11.7–15.5)
MCH: 29.5 pg (ref 27.0–33.0)
MCHC: 33.4 g/dL (ref 32.0–36.0)
MCV: 88.3 fL (ref 80.0–100.0)
MPV: 9.8 fL (ref 7.5–12.5)
PLATELETS: 345 10*3/uL (ref 140–400)
RBC: 4.54 MIL/uL (ref 3.80–5.10)
RDW: 13.4 % (ref 11.0–15.0)
WBC: 10.4 10*3/uL (ref 3.8–10.8)

## 2016-03-04 LAB — BASIC METABOLIC PANEL WITH GFR
BUN: 15 mg/dL (ref 7–25)
CHLORIDE: 103 mmol/L (ref 98–110)
CO2: 23 mmol/L (ref 20–31)
CREATININE: 0.69 mg/dL (ref 0.50–1.10)
Calcium: 9.3 mg/dL (ref 8.6–10.2)
GFR, Est African American: >89 mL/min (ref >=60)
Glucose, Bld: 97 mg/dL (ref 65–99)
POTASSIUM: 4 mmol/L (ref 3.5–5.3)
SODIUM: 137 mmol/L (ref 135–146)

## 2016-03-04 LAB — TSH: TSH: 1.17 m[IU]/L

## 2016-03-04 NOTE — Telephone Encounter (Signed)
Attempted to call patient re normal results.  Recommend that she follow up with her PCP to start antianxiety meds/SSRI, as I believe that her heart palpitations are a result of anxiety.  Will send copy of results to patient's home.  Ashly M. Nadine CountsGottschalk, DO PGY-3, [facility]

## 2016-03-16 ENCOUNTER — Ambulatory Visit: Payer: BLUE CROSS/BLUE SHIELD | Admitting: Family Medicine

## 2016-03-19 ENCOUNTER — Ambulatory Visit: Payer: BLUE CROSS/BLUE SHIELD | Admitting: Student

## 2017-04-06 ENCOUNTER — Other Ambulatory Visit: Payer: Self-pay | Admitting: Obstetrics and Gynecology

## 2017-04-06 DIAGNOSIS — N644 Mastodynia: Secondary | ICD-10-CM

## 2017-04-06 DIAGNOSIS — N63 Unspecified lump in unspecified breast: Secondary | ICD-10-CM

## 2017-04-08 ENCOUNTER — Other Ambulatory Visit: Payer: Self-pay | Admitting: Obstetrics and Gynecology

## 2017-04-08 ENCOUNTER — Encounter: Payer: Self-pay | Admitting: Family Medicine

## 2017-04-08 ENCOUNTER — Ambulatory Visit
Admission: RE | Admit: 2017-04-08 | Discharge: 2017-04-08 | Disposition: A | Discharge status: Home / Self Care | Payer: No Typology Code available for payment source | Source: Ambulatory Visit | Attending: Obstetrics and Gynecology | Admitting: Obstetrics and Gynecology

## 2017-04-08 ENCOUNTER — Ambulatory Visit (HOSPITAL_COMMUNITY)
Admission: RE | Admit: 2017-04-08 | Discharge: 2017-04-08 | Disposition: A | Discharge status: Home / Self Care | Payer: Self-pay | Source: Ambulatory Visit | Attending: Obstetrics and Gynecology | Admitting: Obstetrics and Gynecology

## 2017-04-08 ENCOUNTER — Encounter (HOSPITAL_COMMUNITY): Payer: Self-pay

## 2017-04-08 VITALS — BP 172/90 | HR 86 | Temp 98.1°F | Ht 63.0 in

## 2017-04-08 DIAGNOSIS — N644 Mastodynia: Secondary | ICD-10-CM

## 2017-04-08 DIAGNOSIS — Z1239 Encounter for other screening for malignant neoplasm of breast: Secondary | ICD-10-CM

## 2017-04-08 DIAGNOSIS — N63 Unspecified lump in unspecified breast: Secondary | ICD-10-CM

## 2017-04-08 DIAGNOSIS — N6321 Unspecified lump in the left breast, upper outer quadrant: Secondary | ICD-10-CM

## 2017-04-08 NOTE — Patient Instructions (Addendum)
Explained breast self awareness with Katrina Atkins. Patient is due for a Pap smear. Patient refused Pap smear today. Patient stated she is going to schedule an appointment with her OBGYN to have her IUD removed and will have her Pap smear completed then. Explained to patient the importance of having her Pap smear completed at recommended intervals. Let her know [facility] will cover Pap smears every 3 years unless has a history of abnormal Pap smears. Referred [facility] for diagnostic mammogram and possible left breast ultrasound. Appointment scheduled for Thursday, April 08, 2017 at 0850. Katrina Atkins verbalized understanding.  Veera Stapleton, Kathaleen Maser, RN 10:30 AM

## 2017-04-08 NOTE — Progress Notes (Addendum)
Complaints of left breast pain x 2 days that is constant that patient rates a 5-6 out of 10. Patient complained of bilateral clear breast discharge x 10 years that was spontaneous but states only occurs now when expresses. Patient complained of bilateral itchy breast x 2 weeks.  Pap Smear: Pap smear not completed today. Last Pap smear was 4 years ago and normal per patient. Per patient has no history of an abnormal Pap smear. Patient refused Pap smear today. Patient stated she is going to schedule an appointment with her OBGYN to have her IUD removed and will have her Pap smear completed then. Explained to patient the importance of having her Pap smear completed at recommended intervals. No Pap smear results are in EPIC.  Physical exam: Breasts Right breast is slightly larger than left breast that per patient is normal for her. No skin abnormalities bilateral breasts. No nipple retraction bilateral breasts. No nipple discharge bilateral breasts. Unable to express any nipple discharge on exam. No lymphadenopathy. No lumps palpated right breast. Palpated a lump versus thickened area left breast at 1 o'clock 7.5 cm from the nipple. Complaints of left outer breast tenderness on exam. Referred patient to the [facility] for diagnostic mammogram and possible left breast ultrasound. Appointment scheduled for Thursday, April 08, 2017 at 0850.        Pelvic/Bimanual No Pap smear completed today since patient refused.  Smoking History: Patient is a former smoker that quit 02/23/2003.  Patient Navigation: Patient education provided. Access to services provided for patient through [facility] program.

## 2017-04-08 NOTE — Addendum Note (Signed)
Encounter addended by: Priscille Heidelberg, RN on: 04/08/2017 11:00 AM<BR>    Actions taken: Sign clinical note

## 2017-04-09 ENCOUNTER — Encounter (HOSPITAL_COMMUNITY): Payer: Self-pay | Admitting: *Deleted

## 2017-11-06 IMAGING — CR DG FOOT COMPLETE 3+V*R*
3 series · 3 of 3 positions shown · non-contrast
Comparison: None.

CLINICAL DATA: Right lateral foot bruising, status post fall with
medial right foot and right ankle pain.

EXAM:
RIGHT ANKLE - COMPLETE 3+ VIEW; RIGHT FOOT COMPLETE - 3+ VIEW

[t foot lat right]
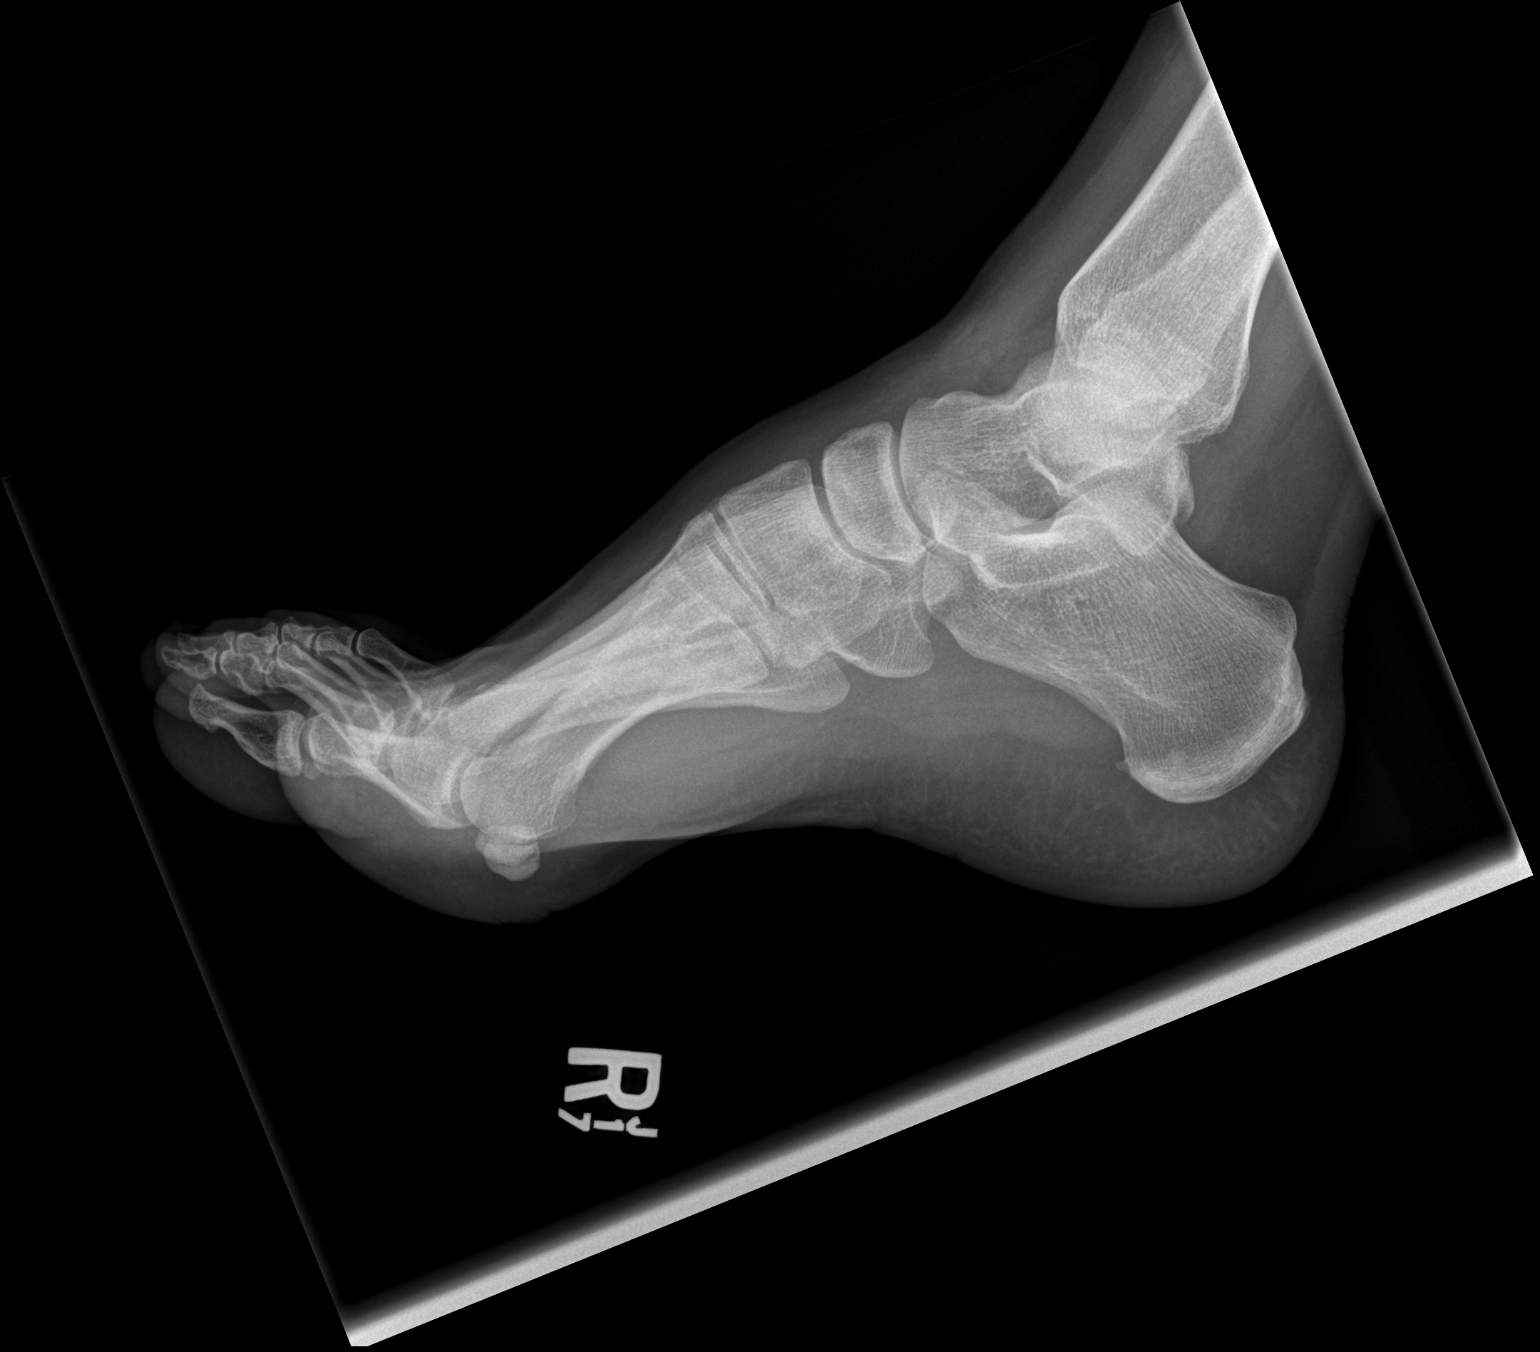

[t foot ap right]
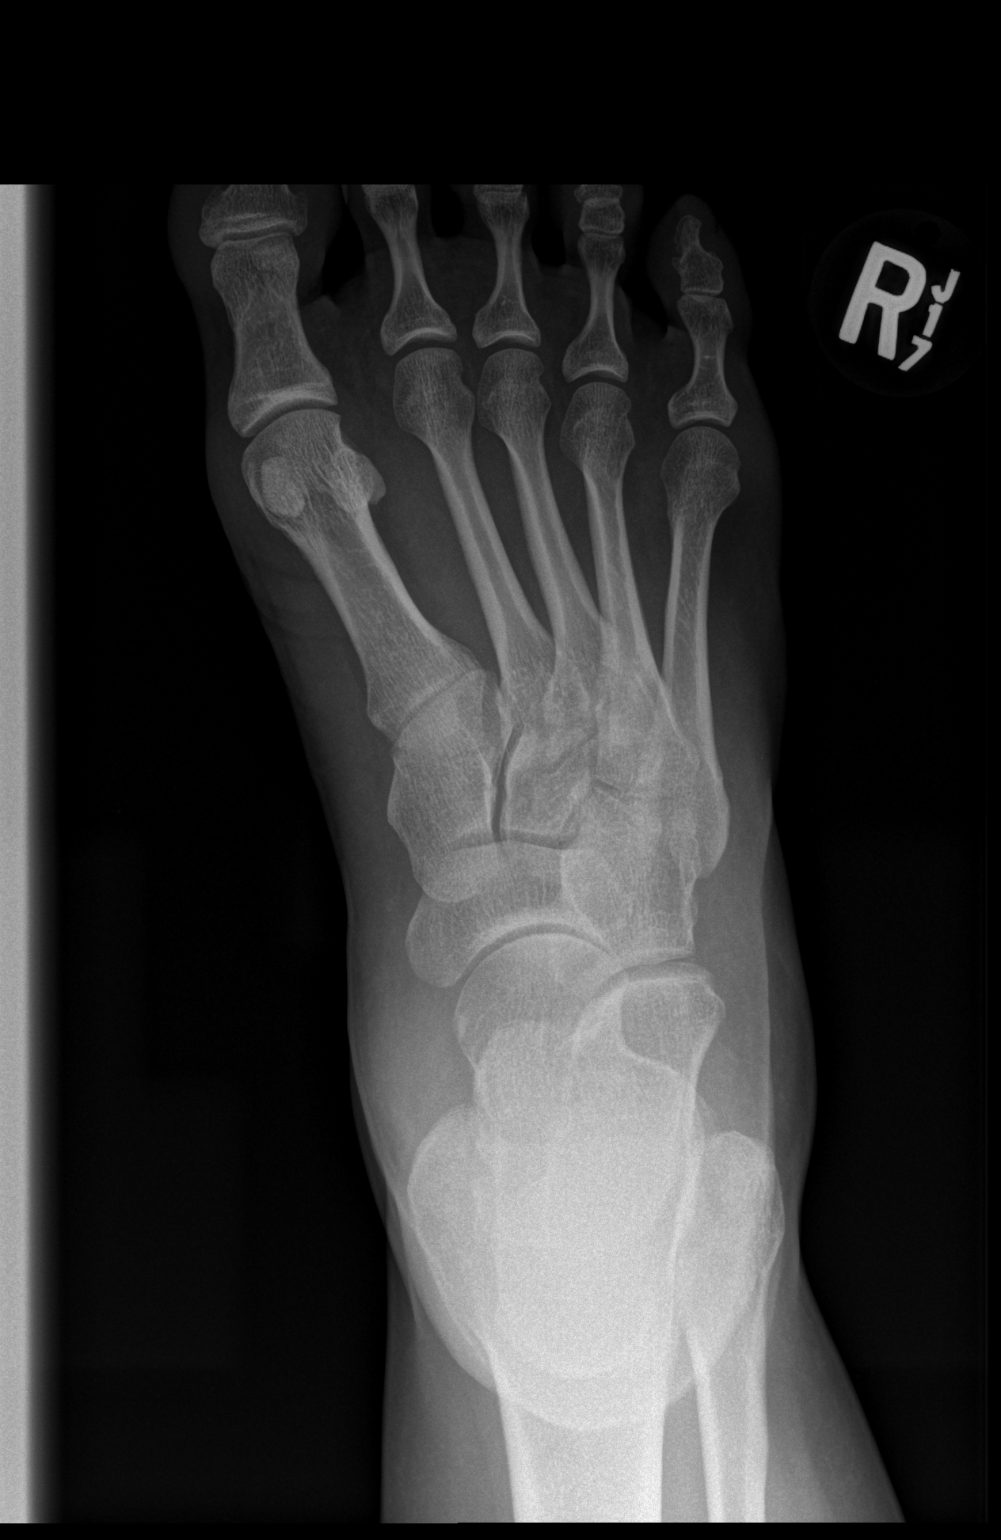

[t foot obl right]
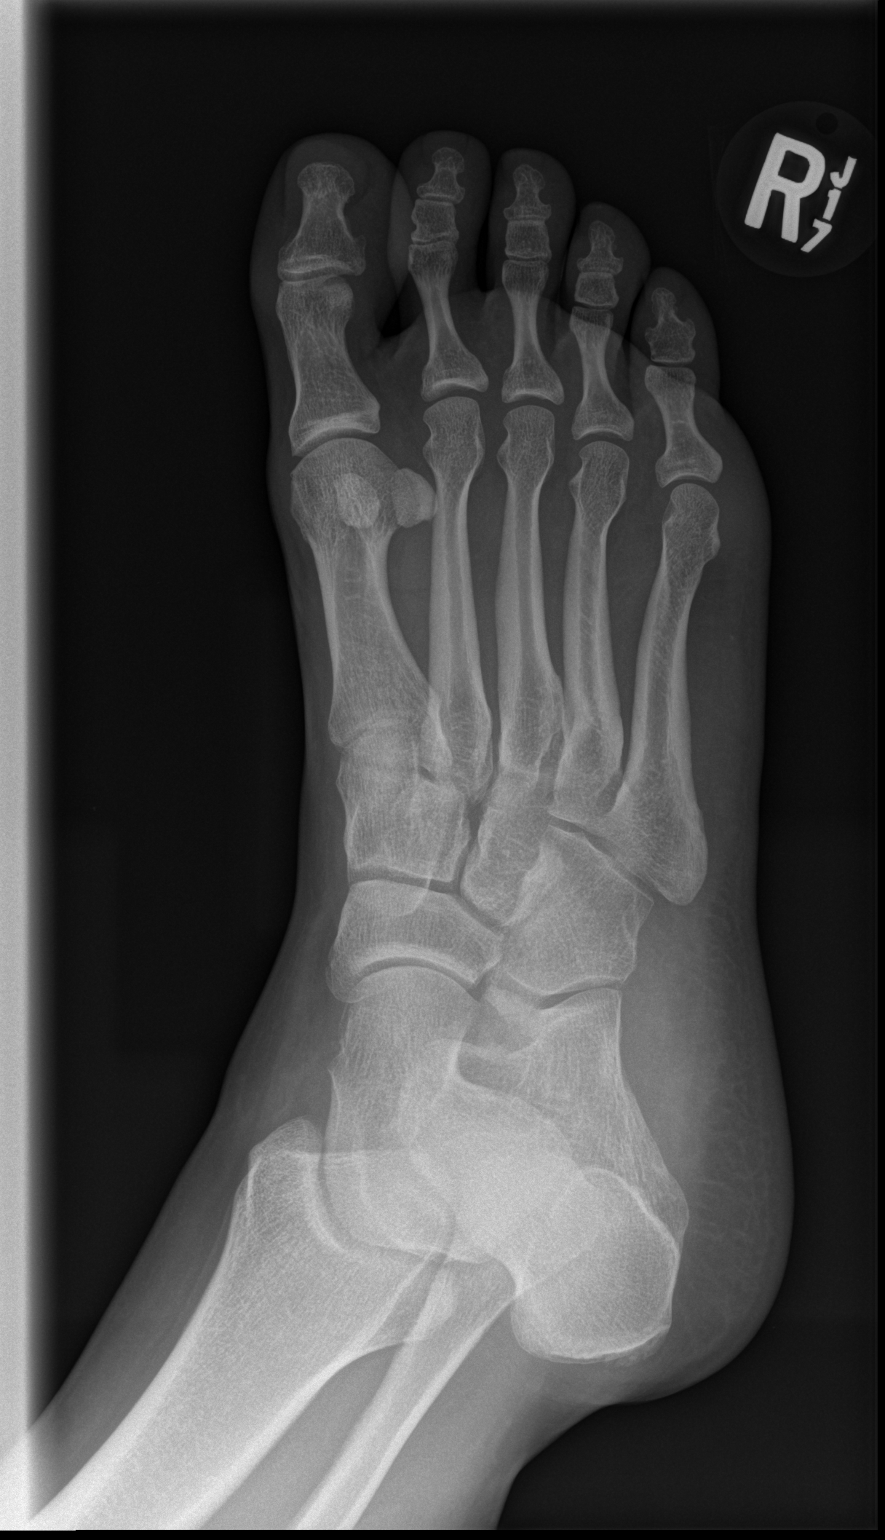

[3 of 3 positions shown; findings below may reference images not displayed]

FINDINGS: There is a small calcific fragment adjacent to the lateral aspect of
the transverse tarsal joint with uncertain donor site. There is an
associated soft tissue swelling. No other displaced fractures are
visualized.

The right ankle has normal appearance.  Ankle mortise is preserved.
IMPRESSION: Small calcific fragment adjacent to the lateral aspect of the
transverse tarsal joint with uncertain donor site, likely
representing small avulsion fracture. Alternatively this may
represent an os peroneum. If patient continues to be symptomatic,
MRI of the foot may be considered.

Associated moderate soft tissue swelling.

## 2018-11-03 ENCOUNTER — Telehealth (INDEPENDENT_AMBULATORY_CARE_PROVIDER_SITE_OTHER): Payer: BLUE CROSS/BLUE SHIELD | Admitting: Family Medicine

## 2018-11-03 ENCOUNTER — Encounter: Payer: Self-pay | Admitting: Family Medicine

## 2018-11-03 ENCOUNTER — Other Ambulatory Visit: Payer: Self-pay

## 2018-11-03 DIAGNOSIS — L240 Irritant contact dermatitis due to detergents: Secondary | ICD-10-CM | POA: Diagnosis not present

## 2018-11-03 MED ORDER — CETIRIZINE HCL 10 MG PO TABS: 10.0000 mg | ORAL_TABLET | Freq: Every day | ORAL | 11 refills | Status: DC

## 2018-11-03 MED ORDER — HYDROCORTISONE 1 % EX OINT: 1.0000 "application " | TOPICAL_OINTMENT | Freq: Two times a day (BID) | CUTANEOUS | 0 refills | Status: DC

## 2018-11-03 NOTE — Progress Notes (Signed)
Katrina Atkins [facility] Telemedicine Visit  Patient consented to have virtual visit. Method of visit: Video  Encounter participants: Patient: Katrina Atkins - located at home Provider: Westley Chandler - located at [facility] Others (if applicable): daughter Al Decant)   Chief Complaint: About chemical exposure and dermatitis  HPI:  Katrina Atkins is a pleasant 43 year old woman with history of bipolar 1 disorder and IUD in place presenting via video visit for questions about possible exposure.  The patient works at home.  Her husband works for himself in a power washing business.  She was helping him with his washing materials which includes industrial grade hypocaloric acid.  She reports they wear protective gear generally, although her husband does not always do this.  She reports that since she has done this her skin has been slightly more pruritic.  She denies any inhalation, pain with swallowing, difficulty breathing, rash, erythema, blisters or gastrointestinal symptoms.  ROS: per HPI  Pertinent PMHx:  IUD in place Bipolar 1 Dyslipidemia Hypertension Exam:  Respiratory: Well-appearing breathing comfortably no respiratory distress and speaking in full sentences Skin examined no rashes or lesions there is some mild xerosis on the extensor surface of her bilateral upper extremities  Assessment/Plan:  Possible Chemical Exposure referred patient to Dietitian and MSDS guidelines for what to wear for protective gear when using this chemical.  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  I recommended wearing appropriate protection when working around this chemical including full face covering hat long sleeves gloves mask and eyewear.  Prescription for topical steroid given for dermatitis that she reports is worsening.  Also refilled patient's antihistamine for her seasonal  allergies.  Time spent during visit with patient: 11 minutes

## 2018-11-04 ENCOUNTER — Telehealth: Payer: Self-pay

## 2018-11-04 DIAGNOSIS — L240 Irritant contact dermatitis due to detergents: Secondary | ICD-10-CM

## 2018-11-04 MED ORDER — HYDROCORTISONE 1 % EX OINT: 1.0000 "application " | TOPICAL_OINTMENT | Freq: Two times a day (BID) | CUTANEOUS | 0 refills | Status: AC

## 2018-11-04 MED ORDER — CETIRIZINE HCL 10 MG PO TABS: 10.0000 mg | ORAL_TABLET | Freq: Every day | ORAL | 11 refills | Status: DC

## 2018-11-04 NOTE — Telephone Encounter (Signed)
Pt called nurse line stating the [facility] that she usually uses is closed due to pandemic. Pt needs her prescriptions from yesterday rerouted to the [facility] on [address].

## 2019-04-12 ENCOUNTER — Encounter: Payer: Self-pay | Admitting: Family Medicine

## 2019-04-12 ENCOUNTER — Ambulatory Visit (INDEPENDENT_AMBULATORY_CARE_PROVIDER_SITE_OTHER): Payer: BLUE CROSS/BLUE SHIELD | Admitting: Family Medicine

## 2019-04-12 ENCOUNTER — Other Ambulatory Visit: Payer: Self-pay

## 2019-04-12 DIAGNOSIS — M26609 Unspecified temporomandibular joint disorder, unspecified side: Secondary | ICD-10-CM

## 2019-04-12 MED ORDER — NAPROXEN 500 MG PO TABS: 500.0000 mg | ORAL_TABLET | Freq: Two times a day (BID) | ORAL | 0 refills | Status: AC

## 2019-04-12 NOTE — Assessment & Plan Note (Signed)
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  Patient take naproxen 500 mg twice daily for 1 week to help with pain.  Gave patient handout with jaw muscle strength and exercises as well as behavioral modifications to help.  Patient to follow-up as needed for this issue.

## 2019-04-12 NOTE — Patient Instructions (Signed)
It was great meeting you today!  I think your jaw pain is consistent with TMJ also known as transmandibular joint disease.  We reviewed the anatomy for this and explained why you are having the pain where you are.  In more moderate cases that swelling you had can be normal.  Typically we will do a anti-inflammatory medication to help decrease inflammation acutely and then have you do some rehab exercises and behavioral modifications to help decrease these flares.  I gave you a handout with his behavioral modifications.  I sent in naproxen 500 mg twice daily to your pharmacy.  If you do not do the naproxen can always try icing the area and taking Tylenol for pain.  The swelling and pain should resolve on its own but will probably go a much faster with the naproxen.  Your bruise looks okay.  Please be careful going up and down stairs, but this should not lead to any long-term problems.

## 2019-04-12 NOTE — Progress Notes (Signed)
   HPI 43 year old female who presents for right jaw pain.  She states that she has intermittent jaw pain and swelling but this is been a chronic problem.  She states that it usually hurts when she is using her mouth such as chewing, laughing, or smiling.  It is described as a dull and aching type pain.  She has never taken anything to help with the pain.  States that aching pain does radiate down the jaw.  CC: Jaw pain   ROS:   Review of Systems See HPI for ROS.   CC, SH/smoking status, and VS noted  Objective: BP (!) 145/85   Pulse 82   Wt 240 lb (108.9 kg)   SpO2 99%   BMI 42.51 kg/m  Gen: Very pleasant 43 year old Caucasian female, no acute distress, resting comfortably HEENT: Notable jaw popping bilateral TMJs.  Mild tenderness to palpation down length of the masseter muscles. 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  Patient take naproxen 500 mg twice daily for 1 week to help with pain.  Gave patient handout with jaw muscle strength and exercises as well as behavioral modifications to help.  Patient to follow-up as needed for this issue.   No orders of the defined types were placed in this encounter.   Meds ordered this encounter  Medications  . naproxen (NAPROSYN) 500 MG tablet    Sig: Take 1 tablet (500 mg total) by mouth 2 (two) times daily with a meal.    Dispense:  30 tablet    Refill:  0    Guadalupe Dawn MD PGY-3 Family Medicine Resident  04/12/2019 2:04 PM

## 2019-11-10 ENCOUNTER — Other Ambulatory Visit: Payer: Self-pay

## 2019-11-10 DIAGNOSIS — L240 Irritant contact dermatitis due to detergents: Secondary | ICD-10-CM

## 2019-11-10 MED ORDER — CETIRIZINE HCL 10 MG PO TABS: 10.0000 mg | ORAL_TABLET | Freq: Every day | ORAL | 11 refills | Status: AC

## 2019-11-16 ENCOUNTER — Other Ambulatory Visit: Payer: Self-pay | Admitting: Family Medicine

## 2019-11-16 DIAGNOSIS — Z1231 Encounter for screening mammogram for malignant neoplasm of breast: Secondary | ICD-10-CM

## 2019-11-20 ENCOUNTER — Ambulatory Visit: Payer: BLUE CROSS/BLUE SHIELD

## 2019-11-24 ENCOUNTER — Ambulatory Visit: Payer: 59

## 2019-11-29 ENCOUNTER — Ambulatory Visit: Payer: 59

## 2020-01-11 ENCOUNTER — Ambulatory Visit: Payer: 59

## 2020-02-01 ENCOUNTER — Ambulatory Visit (INDEPENDENT_AMBULATORY_CARE_PROVIDER_SITE_OTHER): Payer: Self-pay | Admitting: Family Medicine

## 2020-02-15 ENCOUNTER — Ambulatory Visit (INDEPENDENT_AMBULATORY_CARE_PROVIDER_SITE_OTHER): Payer: 59 | Admitting: Family Medicine

## 2020-02-15 ENCOUNTER — Ambulatory Visit (INDEPENDENT_AMBULATORY_CARE_PROVIDER_SITE_OTHER): Payer: Self-pay | Admitting: Family Medicine

## 2020-02-27 ENCOUNTER — Ambulatory Visit (INDEPENDENT_AMBULATORY_CARE_PROVIDER_SITE_OTHER): Payer: 59 | Admitting: Family Medicine

## 2020-02-29 ENCOUNTER — Ambulatory Visit (INDEPENDENT_AMBULATORY_CARE_PROVIDER_SITE_OTHER): Payer: 59 | Admitting: Family Medicine

## 2020-03-19 ENCOUNTER — Ambulatory Visit (INDEPENDENT_AMBULATORY_CARE_PROVIDER_SITE_OTHER): Payer: 59 | Admitting: Family Medicine

## 2021-01-01 ENCOUNTER — Ambulatory Visit: Payer: 59 | Admitting: Family Medicine

## 2021-03-19 ENCOUNTER — Ambulatory Visit: Payer: 59 | Admitting: Family Medicine

## 2021-12-23 ENCOUNTER — Encounter: Payer: Self-pay | Admitting: *Deleted

## 2022-02-25 ENCOUNTER — Encounter (INDEPENDENT_AMBULATORY_CARE_PROVIDER_SITE_OTHER): Payer: Self-pay

## 2022-09-24 ENCOUNTER — Other Ambulatory Visit: Payer: Self-pay | Admitting: Family Medicine

## 2022-09-24 DIAGNOSIS — Z1231 Encounter for screening mammogram for malignant neoplasm of breast: Secondary | ICD-10-CM

## 2022-11-13 ENCOUNTER — Ambulatory Visit: Payer: Self-pay

## 2022-11-27 ENCOUNTER — Ambulatory Visit: Payer: Self-pay

## 2022-12-03 ENCOUNTER — Ambulatory Visit
Admission: RE | Admit: 2022-12-03 | Discharge: 2022-12-03 | Disposition: A | Discharge status: Home / Self Care | Payer: Commercial Managed Care - PPO | Source: Ambulatory Visit | Attending: Family Medicine | Admitting: Family Medicine

## 2022-12-03 DIAGNOSIS — Z1231 Encounter for screening mammogram for malignant neoplasm of breast: Secondary | ICD-10-CM

## 2024-05-29 ENCOUNTER — Other Ambulatory Visit: Payer: Self-pay | Admitting: Family Medicine

## 2024-05-29 DIAGNOSIS — Z1231 Encounter for screening mammogram for malignant neoplasm of breast: Secondary | ICD-10-CM

## 2024-06-22 ENCOUNTER — Ambulatory Visit

## 2024-06-28 ENCOUNTER — Ambulatory Visit

## 2024-07-06 ENCOUNTER — Inpatient Hospital Stay: Admission: RE | Admit: 2024-07-06 | Discharge: 2024-07-06 | Attending: Family Medicine

## 2024-07-06 DIAGNOSIS — Z1231 Encounter for screening mammogram for malignant neoplasm of breast: Secondary | ICD-10-CM
